# Patient Record
Sex: Female | Born: 1990 | Race: White | Hispanic: No | Marital: Single | Smoking: Never smoker
Health system: Southern US, Community
[De-identification: ages and names within clinical notes are randomized; demographics above are authoritative.]

---
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
## 2010-12-29 ENCOUNTER — Emergency Department (HOSPITAL_COMMUNITY)
Admission: EM | Admit: 2010-12-29 | Discharge: 2010-12-29 | Payer: Self-pay | Source: Home / Self Care | Admitting: Emergency Medicine

## 2019-03-20 DIAGNOSIS — R300 Dysuria: Secondary | ICD-10-CM | POA: Diagnosis not present

## 2019-05-02 DIAGNOSIS — D62 Acute posthemorrhagic anemia: Secondary | ICD-10-CM | POA: Diagnosis not present

## 2019-05-02 DIAGNOSIS — N939 Abnormal uterine and vaginal bleeding, unspecified: Secondary | ICD-10-CM | POA: Diagnosis not present

## 2019-05-02 DIAGNOSIS — Z3A00 Weeks of gestation of pregnancy not specified: Secondary | ICD-10-CM | POA: Diagnosis not present

## 2019-05-02 DIAGNOSIS — O081 Delayed or excessive hemorrhage following ectopic and molar pregnancy: Secondary | ICD-10-CM | POA: Diagnosis not present

## 2019-05-02 DIAGNOSIS — O00101 Right tubal pregnancy without intrauterine pregnancy: Secondary | ICD-10-CM | POA: Diagnosis not present

## 2019-05-02 DIAGNOSIS — R109 Unspecified abdominal pain: Secondary | ICD-10-CM | POA: Diagnosis not present

## 2019-05-02 DIAGNOSIS — Z30432 Encounter for removal of intrauterine contraceptive device: Secondary | ICD-10-CM | POA: Diagnosis not present

## 2019-05-02 DIAGNOSIS — O26891 Other specified pregnancy related conditions, first trimester: Secondary | ICD-10-CM | POA: Diagnosis not present

## 2019-05-02 DIAGNOSIS — O009 Unspecified ectopic pregnancy without intrauterine pregnancy: Secondary | ICD-10-CM | POA: Diagnosis not present

## 2019-05-02 HISTORY — PX: DIAGNOSTIC LAPAROSCOPY WITH REMOVAL OF ECTOPIC PREGNANCY: SHX6449

## 2019-05-10 DIAGNOSIS — Z6841 Body Mass Index (BMI) 40.0 and over, adult: Secondary | ICD-10-CM | POA: Diagnosis not present

## 2019-05-10 DIAGNOSIS — F411 Generalized anxiety disorder: Secondary | ICD-10-CM | POA: Diagnosis not present

## 2019-05-10 DIAGNOSIS — R300 Dysuria: Secondary | ICD-10-CM | POA: Diagnosis not present

## 2019-05-25 DIAGNOSIS — F411 Generalized anxiety disorder: Secondary | ICD-10-CM | POA: Diagnosis not present

## 2019-05-25 DIAGNOSIS — Z6841 Body Mass Index (BMI) 40.0 and over, adult: Secondary | ICD-10-CM | POA: Diagnosis not present

## 2019-06-07 ENCOUNTER — Ambulatory Visit (INDEPENDENT_AMBULATORY_CARE_PROVIDER_SITE_OTHER): Payer: Medicaid Other | Admitting: Psychiatry

## 2019-06-07 ENCOUNTER — Other Ambulatory Visit: Payer: Self-pay

## 2019-06-07 ENCOUNTER — Encounter (HOSPITAL_COMMUNITY): Payer: Self-pay | Admitting: Psychiatry

## 2019-06-07 DIAGNOSIS — F4323 Adjustment disorder with mixed anxiety and depressed mood: Secondary | ICD-10-CM
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
## 2019-09-07 DIAGNOSIS — R300 Dysuria: Secondary | ICD-10-CM | POA: Diagnosis not present

## 2019-09-07 DIAGNOSIS — Z6841 Body Mass Index (BMI) 40.0 and over, adult: Secondary | ICD-10-CM | POA: Diagnosis not present

## 2019-10-18 DIAGNOSIS — O99341 Other mental disorders complicating pregnancy, first trimester: Secondary | ICD-10-CM | POA: Diagnosis not present

## 2019-10-18 DIAGNOSIS — Z3401 Encounter for supervision of normal first pregnancy, first trimester: Secondary | ICD-10-CM | POA: Diagnosis not present

## 2019-10-18 DIAGNOSIS — O26841 Uterine size-date discrepancy, first trimester: Secondary | ICD-10-CM | POA: Diagnosis not present

## 2019-10-18 DIAGNOSIS — Z3201 Encounter for pregnancy test, result positive: Secondary | ICD-10-CM | POA: Diagnosis not present

## 2019-10-18 DIAGNOSIS — Z3A08 8 weeks gestation of pregnancy: Secondary | ICD-10-CM | POA: Diagnosis not present

## 2019-10-18 DIAGNOSIS — Z23 Encounter for immunization: Secondary | ICD-10-CM | POA: Diagnosis not present

## 2019-10-18 DIAGNOSIS — Z3682 Encounter for antenatal screening for nuchal translucency: Secondary | ICD-10-CM | POA: Diagnosis not present

## 2019-10-18 DIAGNOSIS — N912 Amenorrhea, unspecified: Secondary | ICD-10-CM | POA: Diagnosis not present

## 2019-10-18 DIAGNOSIS — Z0389 Encounter for observation for other suspected diseases and conditions ruled out: Secondary | ICD-10-CM | POA: Diagnosis not present

## 2019-10-18 DIAGNOSIS — O3680X1 Pregnancy with inconclusive fetal viability, fetus 1: Secondary | ICD-10-CM | POA: Diagnosis not present

## 2019-11-15 DIAGNOSIS — Z3682 Encounter for antenatal screening for nuchal translucency: Secondary | ICD-10-CM | POA: Diagnosis not present

## 2019-11-15 DIAGNOSIS — Z3A12 12 weeks gestation of pregnancy: Secondary | ICD-10-CM | POA: Diagnosis not present

## 2019-12-13 DIAGNOSIS — Z3689 Encounter for other specified antenatal screening: Secondary | ICD-10-CM | POA: Diagnosis not present

## 2020-01-11 DIAGNOSIS — Z3A20 20 weeks gestation of pregnancy: Secondary | ICD-10-CM | POA: Diagnosis not present

## 2020-01-11 DIAGNOSIS — O321XX1 Maternal care for breech presentation, fetus 1: Secondary | ICD-10-CM | POA: Diagnosis not present

## 2020-01-11 DIAGNOSIS — Z363 Encounter for antenatal screening for malformations: Secondary | ICD-10-CM | POA: Diagnosis not present
